# Patient Record
Sex: Female | Born: 2005
Health system: Southern US, Community
[De-identification: ages and names within clinical notes are randomized; demographics above are authoritative.]

---
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
## 2023-01-30 DIAGNOSIS — R319 Hematuria, unspecified: Secondary | ICD-10-CM | POA: Diagnosis not present

## 2023-01-30 DIAGNOSIS — Z6821 Body mass index (BMI) 21.0-21.9, adult: Secondary | ICD-10-CM | POA: Diagnosis not present

## 2023-01-30 DIAGNOSIS — Z01419 Encounter for gynecological examination (general) (routine) without abnormal findings: Secondary | ICD-10-CM | POA: Diagnosis not present

## 2023-07-18 DIAGNOSIS — F10920 Alcohol use, unspecified with intoxication, uncomplicated: Secondary | ICD-10-CM | POA: Diagnosis not present

## 2023-07-18 DIAGNOSIS — R400 Somnolence: Secondary | ICD-10-CM | POA: Diagnosis not present

## 2023-07-18 DIAGNOSIS — Y908 Blood alcohol level of 240 mg/100 ml or more: Secondary | ICD-10-CM | POA: Diagnosis not present

## 2023-07-18 DIAGNOSIS — H5704 Mydriasis: Secondary | ICD-10-CM | POA: Diagnosis not present

## 2023-07-18 DIAGNOSIS — F10120 Alcohol abuse with intoxication, uncomplicated: Secondary | ICD-10-CM | POA: Diagnosis not present

## 2023-09-01 DIAGNOSIS — Z1330 Encounter for screening examination for mental health and behavioral disorders, unspecified: Secondary | ICD-10-CM | POA: Diagnosis not present

## 2023-09-17 DIAGNOSIS — N941 Unspecified dyspareunia: Secondary | ICD-10-CM | POA: Diagnosis not present

## 2023-09-17 DIAGNOSIS — Z113 Encounter for screening for infections with a predominantly sexual mode of transmission: Secondary | ICD-10-CM | POA: Diagnosis not present

## 2023-09-17 DIAGNOSIS — N760 Acute vaginitis: Secondary | ICD-10-CM | POA: Diagnosis not present
